# Patient Record
Sex: Female | Born: 1960 | Race: White | Hispanic: No | Marital: Married
Health system: Southern US, Community
[De-identification: ages and names within clinical notes are randomized; demographics above are authoritative.]

---

## 1998-09-21 ENCOUNTER — Other Ambulatory Visit: Admission: RE | Admit: 1998-09-21 | Discharge: 1998-09-21 | Payer: Self-pay | Admitting: *Deleted

## 2000-04-04 ENCOUNTER — Other Ambulatory Visit: Admission: RE | Admit: 2000-04-04 | Discharge: 2000-04-04 | Payer: Self-pay | Admitting: *Deleted

## 2000-06-27 ENCOUNTER — Encounter (INDEPENDENT_AMBULATORY_CARE_PROVIDER_SITE_OTHER): Payer: Self-pay | Admitting: Specialist

## 2000-06-27 ENCOUNTER — Other Ambulatory Visit: Admission: RE | Admit: 2000-06-27 | Discharge: 2000-06-27 | Payer: Self-pay | Admitting: *Deleted

## 2001-09-11 ENCOUNTER — Other Ambulatory Visit: Admission: RE | Admit: 2001-09-11 | Discharge: 2001-09-11 | Payer: Self-pay | Admitting: *Deleted

## 2001-12-05 ENCOUNTER — Encounter: Admission: RE | Admit: 2001-12-05 | Discharge: 2001-12-05 | Payer: Self-pay

## 2002-10-07 ENCOUNTER — Other Ambulatory Visit: Admission: RE | Admit: 2002-10-07 | Discharge: 2002-10-07 | Payer: Self-pay | Admitting: *Deleted

## 2003-12-26 ENCOUNTER — Ambulatory Visit (HOSPITAL_COMMUNITY): Admission: RE | Admit: 2003-12-26 | Discharge: 2003-12-26 | Payer: Self-pay | Admitting: Obstetrics & Gynecology

## 2004-01-28 ENCOUNTER — Encounter: Admission: RE | Admit: 2004-01-28 | Discharge: 2004-01-28 | Payer: Self-pay | Admitting: Obstetrics & Gynecology

## 2004-04-02 ENCOUNTER — Ambulatory Visit (HOSPITAL_COMMUNITY): Admission: RE | Admit: 2004-04-02 | Discharge: 2004-04-02 | Payer: Self-pay | Admitting: Obstetrics

## 2004-10-08 ENCOUNTER — Inpatient Hospital Stay (HOSPITAL_COMMUNITY): Admission: RE | Admit: 2004-10-08 | Discharge: 2004-10-10 | Payer: Self-pay | Admitting: Obstetrics & Gynecology

## 2004-10-08 ENCOUNTER — Encounter (INDEPENDENT_AMBULATORY_CARE_PROVIDER_SITE_OTHER): Payer: Self-pay | Admitting: *Deleted

## 2005-01-20 ENCOUNTER — Ambulatory Visit (HOSPITAL_COMMUNITY): Admission: RE | Admit: 2005-01-20 | Discharge: 2005-01-20 | Payer: Self-pay | Admitting: Obstetrics & Gynecology

## 2006-02-22 ENCOUNTER — Ambulatory Visit (HOSPITAL_COMMUNITY): Admission: RE | Admit: 2006-02-22 | Discharge: 2006-02-22 | Payer: Self-pay | Admitting: Obstetrics & Gynecology

## 2006-03-07 ENCOUNTER — Encounter: Admission: RE | Admit: 2006-03-07 | Discharge: 2006-03-07 | Payer: Self-pay | Admitting: Obstetrics & Gynecology

## 2006-03-29 ENCOUNTER — Ambulatory Visit (HOSPITAL_COMMUNITY): Admission: RE | Admit: 2006-03-29 | Discharge: 2006-03-29 | Payer: Self-pay | Admitting: Gastroenterology

## 2007-03-22 ENCOUNTER — Ambulatory Visit (HOSPITAL_COMMUNITY): Admission: RE | Admit: 2007-03-22 | Discharge: 2007-03-22 | Payer: Self-pay | Admitting: Obstetrics & Gynecology

## 2008-03-28 ENCOUNTER — Ambulatory Visit (HOSPITAL_COMMUNITY): Admission: RE | Admit: 2008-03-28 | Discharge: 2008-03-28 | Payer: Self-pay | Admitting: Obstetrics & Gynecology

## 2009-05-01 ENCOUNTER — Ambulatory Visit (HOSPITAL_COMMUNITY): Admission: RE | Admit: 2009-05-01 | Discharge: 2009-05-01 | Payer: Self-pay | Admitting: Obstetrics & Gynecology

## 2010-05-05 ENCOUNTER — Encounter: Admission: RE | Admit: 2010-05-05 | Discharge: 2010-05-05 | Payer: Self-pay | Admitting: Family Medicine

## 2010-09-26 ENCOUNTER — Encounter: Payer: Self-pay | Admitting: Obstetrics & Gynecology

## 2011-01-21 NOTE — H&P (Signed)
NAME:  Kim Carroll, HAGE            ACCOUNT NO.:  [id number]   MEDICAL RECORD NO.:  [id number]          PATIENT TYPE:  INP   LOCATION:  NA                            FACILITY:  [facility]   PHYSICIAN:  Roseanna Rainbow, M.D.DATE OF BIRTH:  1960-12-18   DATE OF ADMISSION:  DATE OF DISCHARGE:                                HISTORY & PHYSICAL   CHIEF COMPLAINT:  The patient is a 50 year old Caucasian female with  secondary dysmenorrhea and menorrhagia who presents for total abdominal  hysterectomy.   HISTORY OF PRESENT ILLNESS:  The patient gives a long history of abnormal  uterine bleeding and painful menses.  She is status post an attempt at  conservative management with an endometrial ablation--an endometrial  ablation was performed approximately six months ago with a NovaSure.  Workup  to date has included a pelvic ultrasound that was essentially normal.  She  does not have any demonstrable anemia.  She has also had an attempted  medical management with Depo-Provera.   PAST OBSTETRICAL AND GYNECOLOGICAL HISTORY:  1.  Please see the above.  2.  She has been pregnant four times.  3.  She has had two live births.  4.  She has had a cesarean delivery.  5.  She has had a bilateral tubal ligation.   PAST MEDICAL HISTORY:  1.  Depression.  2.  Migraine headaches.   PAST SURGICAL HISTORY:  Please see the above.   FAMILY HISTORY:  Remarkable for a myocardial infarction.   ALLERGIES:  PENICILLIN.   MEDICATIONS:  Zoloft.   REVIEW OF SYSTEMS:  NEURO:  Headaches.  Difficulty concentrating.  PSYCHIATRIC:  Mood swings and anxiety.  GU:  Decreased in sexual desire.  GENERAL:  Decrease in energy level.   PHYSICAL EXAMINATION:  VITAL SIGNS:  Blood pressure 117/83, pulse 74,  temperature 98.1, weight 185 pounds.  GENERAL:  A well developed, well nourished, Caucasian female in no apparent  distress.  NECK:  Supple.  No thyromegaly.  CHEST:  Lungs clear to auscultation bilaterally.  HEART:  Regular rate and rhythm.  ABDOMEN:  No organomegaly.  PELVIC:  External female genitalia: normal EGBUS.  Speculum exam: the vagina  is clean.  The cervix is without lesions.  On bimanual exam the uterus is  normal size, anteverted, nontender.  The adnexa are not palpable, nontender.  EXTREMITIES:  No clubbing, cyanosis, or edema.  SKIN:  Without rash.   ASSESSMENT:  A patient with secondary dysmenorrhea and menorrhagia  refractory to attempted medical management and a conservative surgical  procedure.   PLAN:  The planned procedure is a total abdominal hysterectomy.  The risks,  benefits, and alternative forms of management were reviewed with the  patient.  Informed consent has been obtained.      LAJ/MEDQ  D:  09/24/2004  T:  09/24/2004  Job:  [id number]

## 2011-01-21 NOTE — Discharge Summary (Signed)
NAMEDARRIN, Kim Carroll            ACCOUNT NO.:  [id number]   MEDICAL RECORD NO.:  [id number]          PATIENT TYPE:  INP   LOCATION:  9310                          FACILITY:  [facility]   PHYSICIAN:  Charles A. Clearance Coots, M.D.DATE OF BIRTH:  [birth year]   DATE OF ADMISSION:  10/08/2004  DATE OF DISCHARGE:  10/10/2004                                 DISCHARGE SUMMARY   ADMISSION DIAGNOSES:  1.  Dysfunctional uterine bleeding.  2.  Pelvic pain.   DISCHARGE DIAGNOSES:  1.  Dysfunctional uterine bleeding.  2.  Pelvic pain.  3.  Status post total abdominal hysterectomy.  4.  Discharged home in good condition.   REASON FOR ADMISSION:  A 49 year old white female with secondary  dysmenorrhea and menorrhagia who had been tried on conservative management  with endometrial ablation, approximately six months ago, and medical  therapy, but did not have adequate relief of her symptoms.  The patient  desired definitive surgical therapy.   PAST MEDICAL HISTORY:  Surgery; endometrial ablation.  Illnesses; depression  and migraine headaches.   MEDICATIONS:  Zoloft.   ALLERGIES:  PENICILLIN.   SOCIAL HISTORY:  Married.  Negative tobacco, alcohol, or recreational drug  use.   FAMILY HISTORY:  Remarkable for myocardial infarction.   REVIEW OF SYSTEMS:  NEUROLOGY:  Headaches and difficulty concentrating.  PSYCHIATRIC:  Mood swings and anxiety.   PHYSICAL EXAMINATION:  GENERAL:  Obese white female in no acute distress.  VITAL SIGNS:  Temperature 98.1, pulse 74, respiratory rate 20, blood  pressure 117/83.  NECK:  Supple, negative for adenopathy.  LUNGS:  Clear to auscultation bilaterally.  HEART:  Regular rate and rhythm.  ABDOMEN:  Soft and nontender.  No organomegaly or masses appreciated.  PELVIC:  Normal external female genitalia.  Vaginal mucosa normal.  Uterus  normal size, anteverted, nontender.  Normal shape and contour.  Adnexa not  palpable due to obesity, but nontender.   LABORATORY  DATA:  Hemoglobin 14.6, hematocrit 42.3, white blood cell count  7300, platelets 237,000.  Basic metabolic panel within normal limits.  Coags  within normal limits.  Comprehensive metabolic panel within normal limits.   HOSPITAL COURSE:  The patient underwent a total abdominal hysterectomy on  October 08, 2004.  There were no intraoperative complications.  Postoperative course was uncomplicated and she was discharged on  postoperative day #2 in good condition.   DISCHARGE LABORATORY DATA:  Hemoglobin 12.4, hematocrit 35.6.  Basic  metabolic panel within normal limits.   DISPOSITION:  Tylox and ibuprofen prescribed for pain.  Routine written  instructions for discharge after hysterectomy were given per booklet.  The  patient is to follow up in the office on Monday for removal of staples and  to schedule a two-week follow-up.      CAH/MEDQ  D:  10/10/2004  T:  10/11/2004  Job:  [id number]

## 2011-01-21 NOTE — Op Note (Signed)
NAME:  Kim Carroll, Kim Carroll            ACCOUNT NO.:  [id number]   MEDICAL RECORD NO.:  [id number]          PATIENT TYPE:  INP   LOCATION:  9310                          FACILITY:  [facility]   PHYSICIAN:  Roseanna Rainbow, M.D.DATE OF BIRTH:  [birth year]   DATE OF PROCEDURE:  10/08/2004  DATE OF DISCHARGE:                                 OPERATIVE REPORT   PREOPERATIVE DIAGNOSIS:  Hypermenorrhea and secondary dysmenorrhea  refractory to conservative and medical therapies.   POSTOPERATIVE DIAGNOSIS:  Hypermenorrhea and secondary dysmenorrhea  refractory to conservative and medical therapies.   PROCEDURE:  Total abdominal hysterectomy with lysis of adhesions.   SURGEON:  Dr. Tamela Oddi   ASSISTANT:  Dr. Clearance Coots   ANESTHESIA:  General tracheal.   COMPLICATIONS:  None.   ESTIMATED BLOOD LOSS:  100 mL.   FLUIDS AND URINE OUTPUT:  As per anesthesiology.   FINDINGS:  Exam under anesthesia essentially normal.   OPERATIVE FINDINGS:  There were adhesions involving the anterior cul-de-sac.  There was also an adhesion involving the sigmoid colon to the left fundus.  The ovaries appeared normal.   PROCEDURE:  The risks, benefits, indications, and alternatives of the  procedures were reviewed with the patient, and informed consent had been  obtained.  The patient was taken to the operating room with an IV running.  The patient was placed in the dorsal lithotomy position, given general  anesthesia, and prepped and draped in the usual sterile fashion.  A  Pfannenstiel incision was then made through the previous scar and extended  to the rectus fascia with the Bovie.  The fascia was then incised  bilaterally with curved Mayo scissors and the muscles of the anterior  abdominal wall were separated in the midline.  The parietal peritoneum was  entered sharply.  The pelvis was examined with the findings noted above.  An  O'Connor-O'Sullivan retractor was then placed into the incision and the  bowel packed away with moistened laparotomy sponges.  Two long Kelly clamps  were placed on the cornu and used for retraction.  The round ligaments on  both sides were then divided with the Bovie.  The anterior lip of the broad  ligament was incised along the bladder reflection to the midline from both  sides.  The anterior cul-de-sac adhesions as well as the bladder were then  sharply dissected off the lower uterine segment and cervix.  The utero-  ovarian ligaments and fallopian tubes on both sides were then doubly  clamped, transected, and both free ligatures and suture ligatures were  placed using O Vicryl.  Hemostasis was visualized.  The uterine arteries  were skeletonized bilaterally, clamped with parametrial clamps, transected,  and suture-ligated with O Vicryl.  Again, hemostasis was assured.  The  uterosacral ligaments were clamped on both sides, transected, and suture  ligated in a similar fashion.  The cervix and uterus were amputated with  scissors.  The vaginal cuff angles were closed with suture ligatures of O  Vicryl.  The remainder of the vaginal cuff was closed with interrupted 0  Vicryl figure-of-eight sutures.  Adequate hemostasis again was assured.  The  pelvis was irrigated copiously with warm normal saline.  All laparotomy  sponges and instruments were removed from the abdomen.  The fascia was  closed with running O PDS, and hemostasis was assured.  The skin was closed  with staples.  Sponge, lap, needle, and instrument counts were correct x 2.  The patient was taken to the PACU awake and in stable condition.      LAJ/MEDQ  D:  10/08/2004  T:  10/08/2004  Job:  [id number]

## 2011-01-21 NOTE — Op Note (Signed)
NAME:  Kim Carroll, Kim Carroll            ACCOUNT NO.:  [id number]   MEDICAL RECORD NO.:  [id number]          PATIENT TYPE:  AMB   LOCATION:  ENDO                         FACILITY:  [facility]   PHYSICIAN:  Shirley Friar, MDDATE OF BIRTH:  11-Dec-1960   DATE OF PROCEDURE:  03/29/2006  DATE OF DISCHARGE:                                 OPERATIVE REPORT   PROCEDURE:  Upper endoscopy and Bravo placement.   INDICATIONS FOR PROCEDURE:  Atypical chest pain, lump sensation.   MEDICATIONS:  Fentanyl 100 mcg IV, Versed 10 mg IV.   FINDINGS:  The endoscope was inserted into the oropharynx and the esophagus  was intubated which was normal in its entirety.  The squamocolumnar junction  was noted at 37 cm from the incisors and 40 cm from the outside of the bite  block.  The endoscope was then advanced into the stomach which was normal in  its entirety.  Retroflexion was done which revealed normal proximal stomach  and angularis.  The endoscope was straightened and advanced down to the  duodenal bulb and second portion of duodenum which were both normal.  The  endoscope was withdrawn back into the esophagus and the squamocolumnar  junction was again noted at the above markings and then the endoscope was  removed.  The Bravo capsule was then placed after it had been calibrated  into the wall of the esophagus 6 cm above the squamocolumnar junction.  The  endoscope was then inserted back into the esophagus and the placement of the  Bravo capsule was confirmed.      Shirley Friar, MD  Electronically Signed     VCS/MEDQ  D:  03/29/2006  T:  03/29/2006  Job:  [id number]   cc:   Chales Salmon. Abigail Miyamoto, M.D.

## 2011-01-21 NOTE — Op Note (Signed)
NAME:  Kim Carroll, Kim Carroll                        ACCOUNT NO.:  [id number]   MEDICAL RECORD NO.:  [id number]                   PATIENT TYPE:  AMB   LOCATION:  SDC                                  [facility]   PHYSICIAN:  Charles A. Clearance Coots, M.D.             DATE OF BIRTH:  [birth year]   DATE OF PROCEDURE:  04/02/2004  DATE OF DISCHARGE:                                 OPERATIVE REPORT   PREOPERATIVE DIAGNOSES:  Menorrhagia.   POSTOPERATIVE DIAGNOSES:  Menorrhagia.   PROCEDURE:  Hysteroscopy, bipolar endometrial ablation (Novasure).   SURGEON:  Charles A. Clearance Coots, M.D.   ANESTHESIA:  General.   ESTIMATED BLOOD LOSS:  Negligible.   COMPLICATIONS:  None.   SPECIMENS:  None.   DESCRIPTION OF PROCEDURE:  The patient was brought to the operating room and  after satisfactory general endotracheal anesthesia, the legs were brought up  in stirrups and the vagina was prepped and draped in the usual sterile  fashion. The urinary bladder was emptied of approximately 300 mL of clear  urine. Bimanual examination revealed the uterus to be mid position, normal  size, shape and contour. A sterile speculum was inserted in the vaginal  vault and the cervix was isolated. The anterior lip of the cervix was  grasped with a single tooth tenaculum.  A paracervical block of  approximately 20 mL of 2% Xylocaine was injected in each lateral fornix with  approximately 10 mL in each lateral fornix with a total of 20 mL.  The  uterus was sounded to 9 cm.  The level of the internal os of the cervix was  measured with Hegar dilator. The cervix was then dilated to a #23 Pratt  dilator. The 5 mm hysteroscope was then introduced into the uterine cavity  and survey of the uterine cavity was done via hysteroscopy.  There were no  endometrial polyps noted.  The bipolar endometrial ablation procedure was  then done in routine fashion without complications with power of 158 watts  at a time of 50 seconds.  There were  no complications. Post procedure  hysteroscopy was then performed and the cavity appeared to have a good  result from the bipolar ablation. All instruments were retired. The patient  tolerated the procedure well and was transported to the recovery room in  satisfactory condition.                                               Charles A. Clearance Coots, M.D.    CAH/MEDQ  D:  04/02/2004  T:  04/02/2004  Job:  [id number]

## 2011-01-21 NOTE — Op Note (Signed)
NAME:  Kim Carroll, Kim Carroll            ACCOUNT NO.:  [id number]   MEDICAL RECORD NO.:  [id number]          PATIENT TYPE:  AMB   LOCATION:  ENDO                         FACILITY:  [facility]   PHYSICIAN:  Shirley Friar, MDDATE OF BIRTH:  16-Dec-1960   DATE OF PROCEDURE:  03/29/2006  DATE OF DISCHARGE:  03/29/2006                                 OPERATIVE REPORT   PROCEDURE:  Bravo capsule reading.   INDICATION:  Chest pain.   FINDINGS:  DeMeester score - acid reflux analysis on day 1 equals total  score of 4.6 with normal being less than 14.72.  DeMeester score - acid  reflux analysis of day 2 equals total score of 10.4 with normals being less  than 14.72.  Total DeMeester score 7.4 with normal being less than 14.72.   IMPRESSION:  Normal Bravo capsule study - chest pain not related to acid  reflux.  Possibilities for chest pain include functional pain versus  nonacidic reflux versus nondigestive source such as pulmonary/cardiac.   PLAN:  1. Defer to patient's primary care physician whether additional workup is      needed for nondigestive sources.  2. Change AcipHex to once a day or as needed.      Shirley Friar, MD  Electronically Signed     VCS/MEDQ  D:  04/19/2006  T:  04/19/2006  Job:  [id number]   cc:   Chales Salmon. Abigail Miyamoto, M.D.

## 2011-02-07 ENCOUNTER — Other Ambulatory Visit: Payer: Self-pay | Admitting: Family Medicine

## 2011-02-07 DIAGNOSIS — R609 Edema, unspecified: Secondary | ICD-10-CM

## 2011-02-07 DIAGNOSIS — R52 Pain, unspecified: Secondary | ICD-10-CM

## 2011-02-08 ENCOUNTER — Ambulatory Visit
Admission: RE | Admit: 2011-02-08 | Discharge: 2011-02-08 | Disposition: A | Discharge status: Home / Self Care | Payer: BC Managed Care – PPO | Source: Ambulatory Visit | Attending: Family Medicine | Admitting: Family Medicine

## 2011-02-08 DIAGNOSIS — R52 Pain, unspecified: Secondary | ICD-10-CM

## 2011-02-08 DIAGNOSIS — R609 Edema, unspecified: Secondary | ICD-10-CM

## 2011-07-06 ENCOUNTER — Other Ambulatory Visit: Payer: Self-pay | Admitting: Family Medicine

## 2011-07-06 DIAGNOSIS — Z1231 Encounter for screening mammogram for malignant neoplasm of breast: Secondary | ICD-10-CM

## 2011-07-19 ENCOUNTER — Ambulatory Visit
Admission: RE | Admit: 2011-07-19 | Discharge: 2011-07-19 | Disposition: A | Discharge status: Home / Self Care | Payer: BC Managed Care – PPO | Source: Ambulatory Visit | Attending: Family Medicine | Admitting: Family Medicine

## 2011-07-19 DIAGNOSIS — Z1231 Encounter for screening mammogram for malignant neoplasm of breast: Secondary | ICD-10-CM

## 2012-07-04 ENCOUNTER — Other Ambulatory Visit: Payer: Self-pay | Admitting: Family Medicine

## 2012-07-04 DIAGNOSIS — Z1231 Encounter for screening mammogram for malignant neoplasm of breast: Secondary | ICD-10-CM

## 2012-07-19 ENCOUNTER — Ambulatory Visit (INDEPENDENT_AMBULATORY_CARE_PROVIDER_SITE_OTHER): Payer: BC Managed Care – PPO

## 2012-07-19 DIAGNOSIS — Z1231 Encounter for screening mammogram for malignant neoplasm of breast: Secondary | ICD-10-CM

## 2014-04-18 ENCOUNTER — Other Ambulatory Visit: Payer: Self-pay | Admitting: Family Medicine

## 2014-04-18 DIAGNOSIS — Z139 Encounter for screening, unspecified: Secondary | ICD-10-CM

## 2014-04-22 ENCOUNTER — Ambulatory Visit: Payer: BC Managed Care – PPO

## 2014-05-01 ENCOUNTER — Ambulatory Visit (INDEPENDENT_AMBULATORY_CARE_PROVIDER_SITE_OTHER): Payer: BC Managed Care – PPO

## 2014-05-01 DIAGNOSIS — Z139 Encounter for screening, unspecified: Secondary | ICD-10-CM

## 2014-05-01 DIAGNOSIS — Z1231 Encounter for screening mammogram for malignant neoplasm of breast: Secondary | ICD-10-CM

## 2015-04-03 ENCOUNTER — Other Ambulatory Visit: Payer: Self-pay | Admitting: Family Medicine

## 2015-04-03 DIAGNOSIS — Z1231 Encounter for screening mammogram for malignant neoplasm of breast: Secondary | ICD-10-CM

## 2015-05-06 ENCOUNTER — Ambulatory Visit: Payer: BC Managed Care – PPO

## 2015-05-12 ENCOUNTER — Ambulatory Visit (INDEPENDENT_AMBULATORY_CARE_PROVIDER_SITE_OTHER): Payer: BC Managed Care – PPO

## 2015-05-12 DIAGNOSIS — Z1231 Encounter for screening mammogram for malignant neoplasm of breast: Secondary | ICD-10-CM

## 2016-05-24 ENCOUNTER — Other Ambulatory Visit: Payer: Self-pay | Admitting: Family Medicine

## 2016-05-24 DIAGNOSIS — Z1231 Encounter for screening mammogram for malignant neoplasm of breast: Secondary | ICD-10-CM

## 2016-05-31 ENCOUNTER — Ambulatory Visit (INDEPENDENT_AMBULATORY_CARE_PROVIDER_SITE_OTHER): Payer: BC Managed Care – PPO

## 2016-05-31 DIAGNOSIS — Z1231 Encounter for screening mammogram for malignant neoplasm of breast: Secondary | ICD-10-CM

## 2017-07-04 ENCOUNTER — Other Ambulatory Visit: Payer: Self-pay | Admitting: Family Medicine

## 2017-07-04 DIAGNOSIS — Z1231 Encounter for screening mammogram for malignant neoplasm of breast: Secondary | ICD-10-CM

## 2017-07-07 ENCOUNTER — Ambulatory Visit: Payer: BC Managed Care – PPO

## 2017-07-14 ENCOUNTER — Other Ambulatory Visit: Payer: Self-pay | Admitting: Family Medicine

## 2017-07-14 ENCOUNTER — Ambulatory Visit (INDEPENDENT_AMBULATORY_CARE_PROVIDER_SITE_OTHER): Payer: BC Managed Care – PPO

## 2017-07-14 DIAGNOSIS — Z1231 Encounter for screening mammogram for malignant neoplasm of breast: Secondary | ICD-10-CM

## 2017-07-31 ENCOUNTER — Other Ambulatory Visit: Payer: Self-pay | Admitting: Family Medicine

## 2018-06-21 ENCOUNTER — Other Ambulatory Visit: Payer: Self-pay | Admitting: Family Medicine

## 2018-06-21 DIAGNOSIS — I10 Essential (primary) hypertension: Secondary | ICD-10-CM

## 2018-06-21 DIAGNOSIS — Z8249 Family history of ischemic heart disease and other diseases of the circulatory system: Secondary | ICD-10-CM

## 2018-07-05 ENCOUNTER — Ambulatory Visit
Admission: RE | Admit: 2018-07-05 | Discharge: 2018-07-05 | Disposition: A | Discharge status: Home / Self Care | Payer: No Typology Code available for payment source | Source: Ambulatory Visit | Attending: Family Medicine | Admitting: Family Medicine

## 2018-07-05 DIAGNOSIS — Z8249 Family history of ischemic heart disease and other diseases of the circulatory system: Secondary | ICD-10-CM

## 2018-07-05 DIAGNOSIS — I10 Essential (primary) hypertension: Secondary | ICD-10-CM

## 2019-09-06 DIAGNOSIS — C569 Malignant neoplasm of unspecified ovary: Secondary | ICD-10-CM

## 2019-09-06 DIAGNOSIS — Z9221 Personal history of antineoplastic chemotherapy: Secondary | ICD-10-CM

## 2019-09-06 HISTORY — DX: Malignant neoplasm of unspecified ovary: C56.9

## 2019-09-06 HISTORY — DX: Personal history of antineoplastic chemotherapy: Z92.21

## 2019-10-03 ENCOUNTER — Other Ambulatory Visit: Payer: Self-pay | Admitting: Family Medicine

## 2019-10-03 DIAGNOSIS — Z1231 Encounter for screening mammogram for malignant neoplasm of breast: Secondary | ICD-10-CM

## 2019-10-10 ENCOUNTER — Ambulatory Visit (INDEPENDENT_AMBULATORY_CARE_PROVIDER_SITE_OTHER): Payer: BC Managed Care – PPO

## 2019-10-10 ENCOUNTER — Other Ambulatory Visit: Payer: Self-pay

## 2019-10-10 DIAGNOSIS — Z1231 Encounter for screening mammogram for malignant neoplasm of breast: Secondary | ICD-10-CM | POA: Diagnosis not present

## 2019-10-25 ENCOUNTER — Ambulatory Visit: Payer: BC Managed Care – PPO | Attending: Internal Medicine

## 2019-10-25 DIAGNOSIS — Z23 Encounter for immunization: Secondary | ICD-10-CM

## 2019-10-25 NOTE — Progress Notes (Signed)
   Covid-19 Vaccination Clinic  Name:  Charna Fathi    MRN: [id number] DOB: 1961-03-11  10/25/2019  Ms. Jiggetts was observed post Covid-19 immunization for 15 minutes without incidence. She was provided with Vaccine Information Sheet and instruction to access the V-Safe system.   Ms. Trudo was instructed to call 911 with any severe reactions post vaccine: Marland Kitchen Difficulty breathing  . Swelling of your face and throat  . A fast heartbeat  . A bad rash all over your body  . Dizziness and weakness    Immunizations Administered    Name Date Dose VIS Date Route   Pfizer COVID-19 Vaccine 10/25/2019 11:55 AM 0.3 mL 08/16/2019 Intramuscular   Manufacturer: [place]   Lot: [id number]   [place]: [id number]

## 2019-11-19 ENCOUNTER — Ambulatory Visit: Payer: BC Managed Care – PPO | Attending: Internal Medicine

## 2019-11-19 DIAGNOSIS — Z23 Encounter for immunization: Secondary | ICD-10-CM

## 2019-11-19 NOTE — Progress Notes (Signed)
   Covid-19 Vaccination Clinic  Name:  Kim Carroll    MRN: [id number] DOB: 01/03/1961  11/19/2019  [name] was observed post Covid-19 immunization for 15 minutes without incident. She was provided with Vaccine Information Sheet and instruction to access the V-Safe system.   [name] was instructed to call 911 with any severe reactions post vaccine: Marland Kitchen Difficulty breathing  . Swelling of face and throat  . A fast heartbeat  . A bad rash all over body  . Dizziness and weakness   Immunizations Administered    Name Date Dose VIS Date Route   Pfizer COVID-19 Vaccine 11/19/2019  3:41 PM 0.3 mL 08/16/2019 Intramuscular   Manufacturer: [place]   Lot: [id number]   [place]: [id number]

## 2020-10-12 ENCOUNTER — Other Ambulatory Visit: Payer: Self-pay | Admitting: Family Medicine

## 2020-10-12 DIAGNOSIS — Z1231 Encounter for screening mammogram for malignant neoplasm of breast: Secondary | ICD-10-CM

## 2020-11-11 ENCOUNTER — Other Ambulatory Visit: Payer: Self-pay

## 2020-11-11 ENCOUNTER — Ambulatory Visit (INDEPENDENT_AMBULATORY_CARE_PROVIDER_SITE_OTHER): Payer: BC Managed Care – PPO

## 2020-11-11 DIAGNOSIS — Z1231 Encounter for screening mammogram for malignant neoplasm of breast: Secondary | ICD-10-CM

## 2022-02-01 ENCOUNTER — Other Ambulatory Visit: Payer: Self-pay | Admitting: Family Medicine

## 2022-02-01 DIAGNOSIS — Z1231 Encounter for screening mammogram for malignant neoplasm of breast: Secondary | ICD-10-CM

## 2022-02-02 ENCOUNTER — Ambulatory Visit: Payer: BC Managed Care – PPO

## 2022-03-02 ENCOUNTER — Ambulatory Visit (INDEPENDENT_AMBULATORY_CARE_PROVIDER_SITE_OTHER): Payer: BC Managed Care – PPO

## 2022-03-02 DIAGNOSIS — Z1231 Encounter for screening mammogram for malignant neoplasm of breast: Secondary | ICD-10-CM | POA: Diagnosis not present

## 2023-01-06 IMAGING — MG MM DIGITAL SCREENING BILAT W/ TOMO AND CAD
8 series · 8 of 24 positions shown · non-contrast
Comparison: Previous exam(s).

CLINICAL DATA: Screening.

EXAM:
DIGITAL SCREENING BILATERAL MAMMOGRAM WITH TOMOSYNTHESIS AND CAD
TECHNIQUE: Bilateral screening digital craniocaudal and mediolateral oblique
mammograms were obtained. Bilateral screening digital breast
tomosynthesis was performed. The images were evaluated with
computer-aided detection.

[R CC synth-2D]
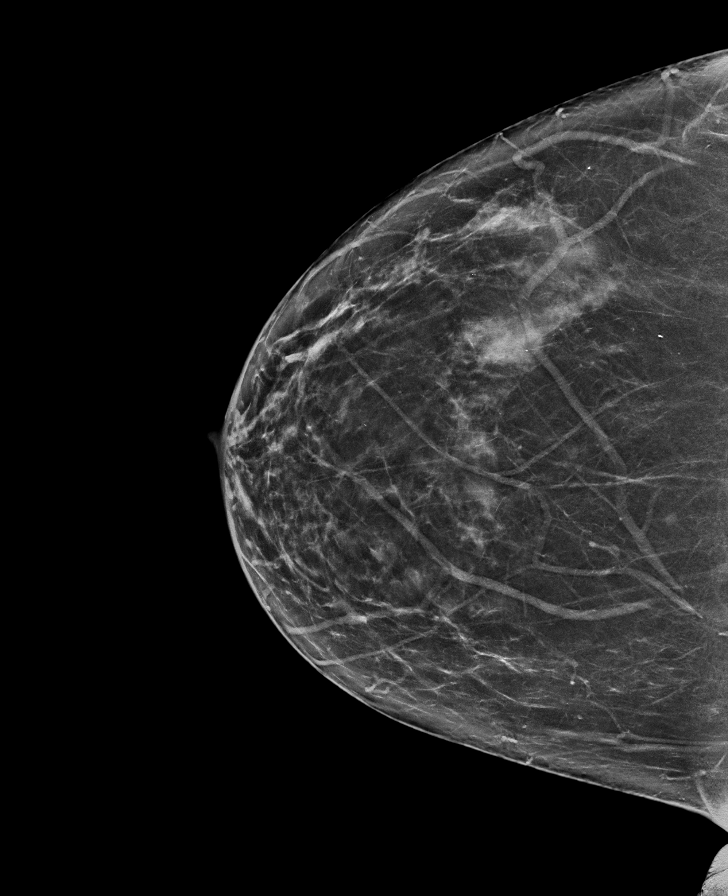

[L CC synth-2D]
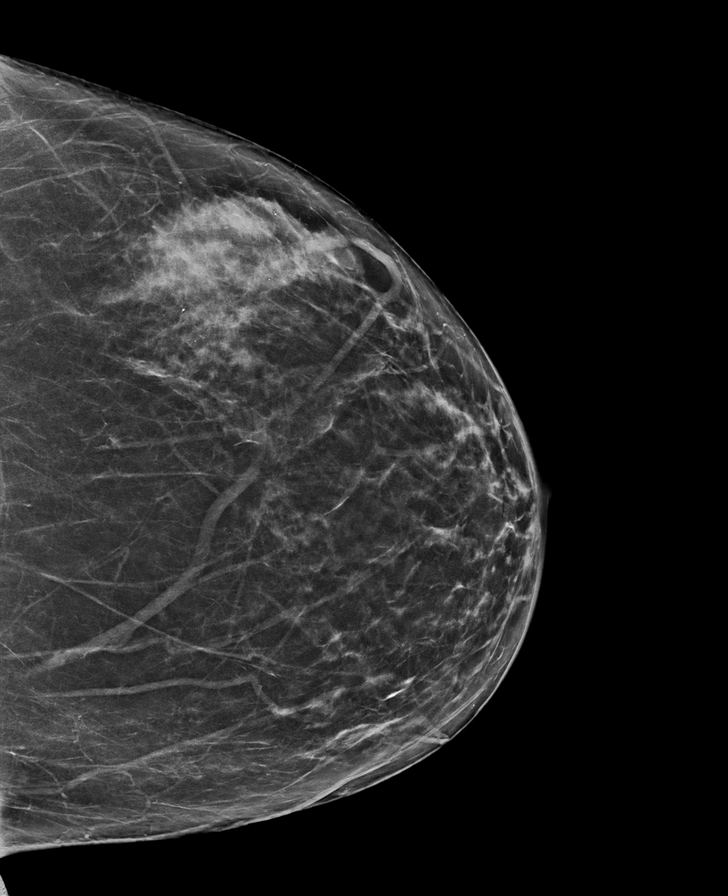

[R MLO synth-2D]
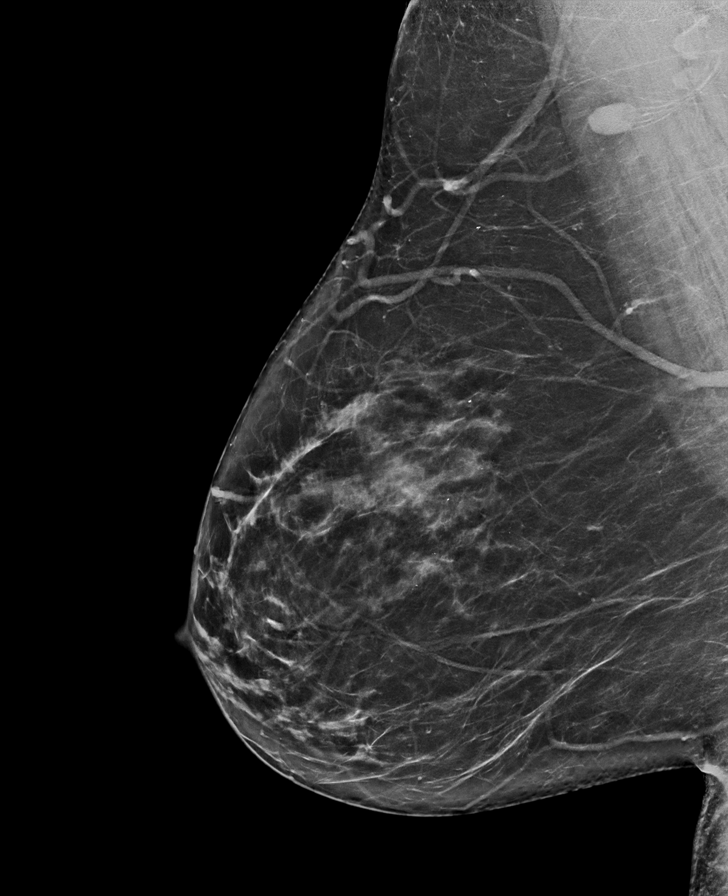

[L MLO synth-2D]
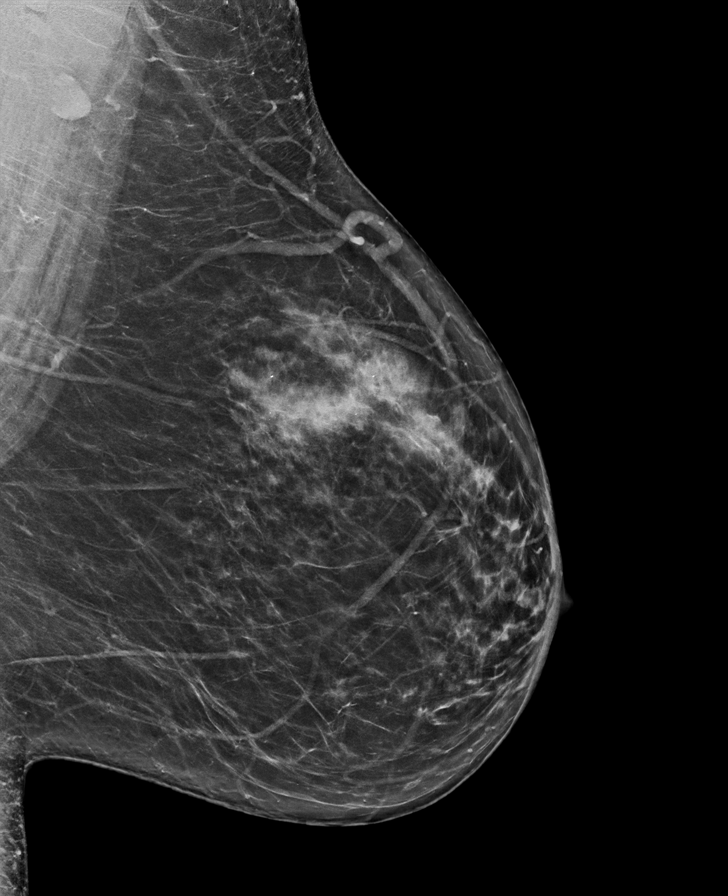

[R CC tomo · tomo slice 37/73.0]
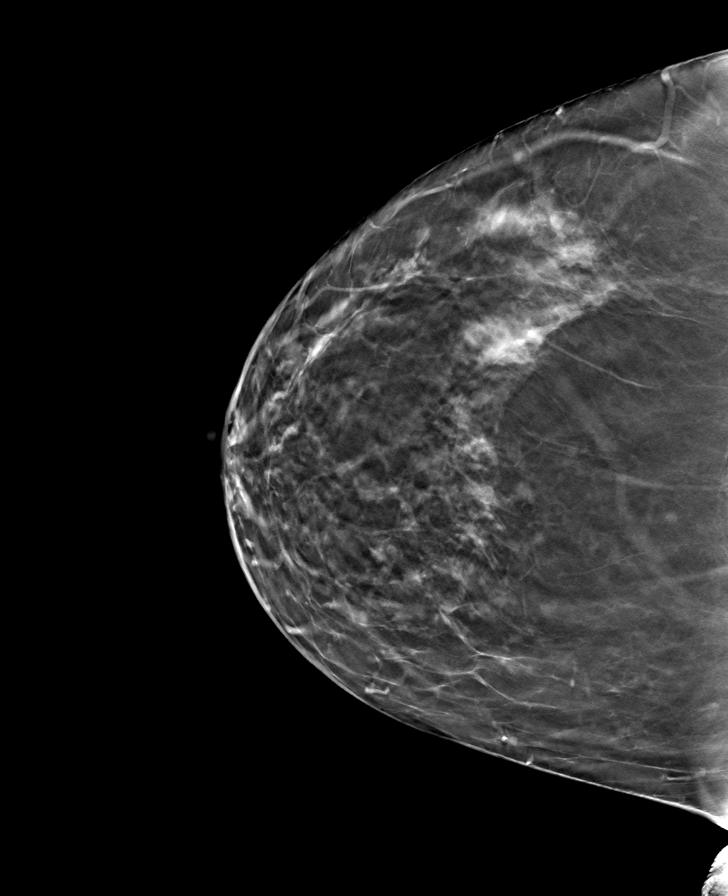

[L MLO tomo · tomo slice 41/81.0]
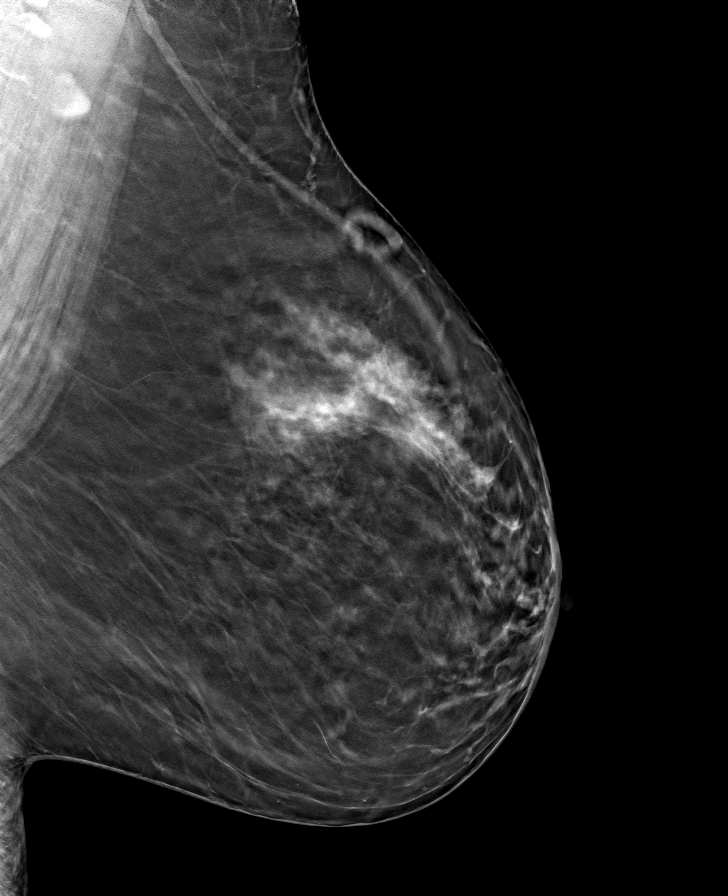

[L CC tomo · tomo slice 37/72.0]
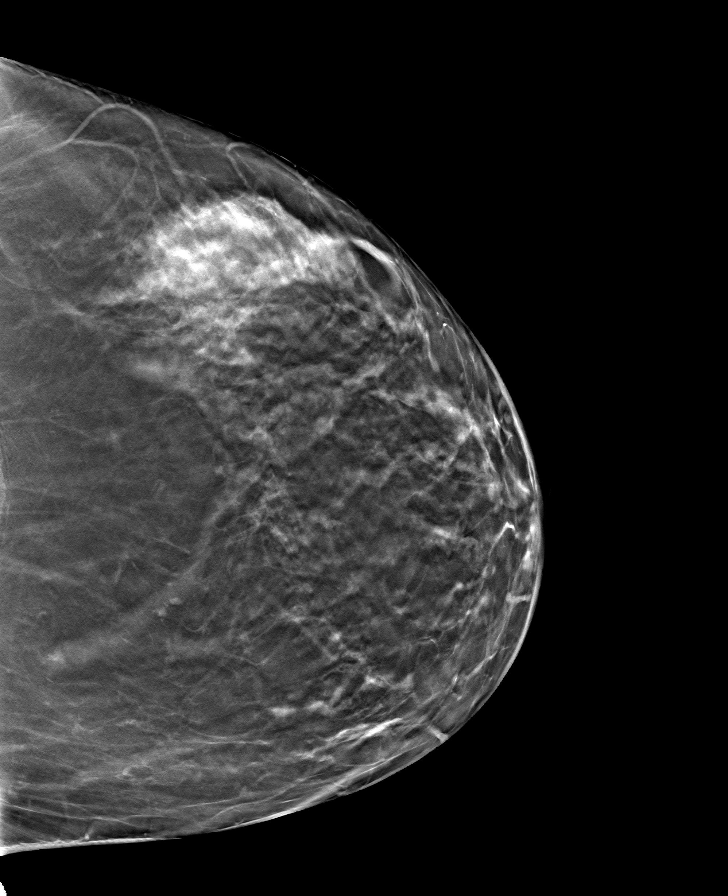

[R MLO tomo · tomo slice 41/82.0]
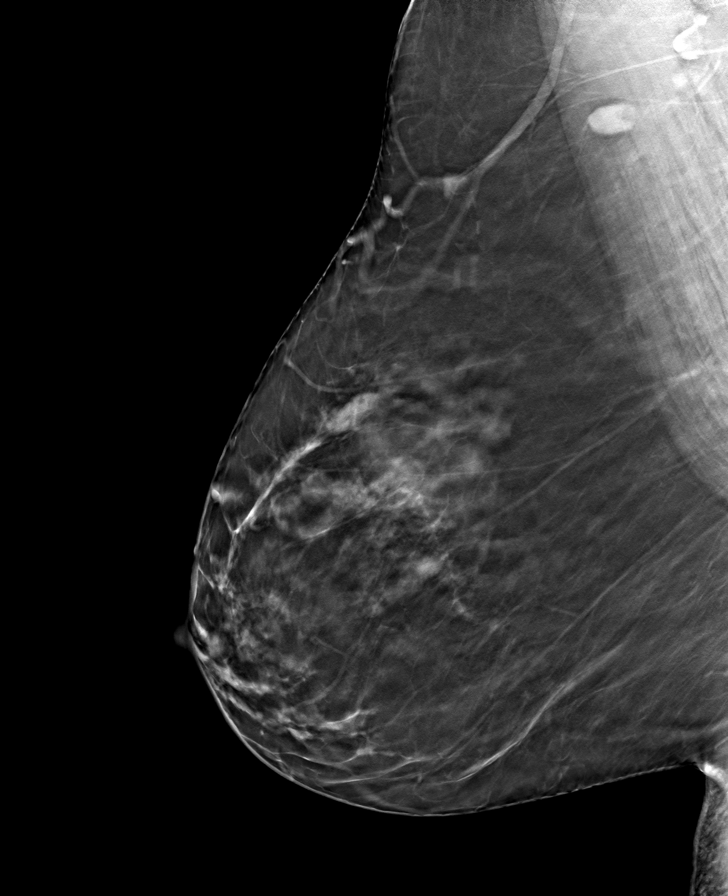

[8 of 24 positions shown; findings below may reference images not displayed]

ACR Breast Density Category b: There are scattered areas of
fibroglandular density.
FINDINGS: There are no findings suspicious for malignancy. The images were
evaluated with computer-aided detection.
IMPRESSION: No mammographic evidence of malignancy. A result letter of this
screening mammogram will be mailed directly to the patient.

RECOMMENDATION:
Screening mammogram in one year. (Code:WJ-I-BG6)

BI-RADS CATEGORY  1: Negative.

## 2023-03-06 ENCOUNTER — Other Ambulatory Visit: Payer: Self-pay | Admitting: Family Medicine

## 2023-03-06 DIAGNOSIS — Z1231 Encounter for screening mammogram for malignant neoplasm of breast: Secondary | ICD-10-CM

## 2023-03-15 ENCOUNTER — Ambulatory Visit: Payer: BC Managed Care – PPO

## 2023-03-15 DIAGNOSIS — Z1231 Encounter for screening mammogram for malignant neoplasm of breast: Secondary | ICD-10-CM

## 2024-05-24 ENCOUNTER — Other Ambulatory Visit: Payer: Self-pay | Admitting: Family Medicine

## 2024-05-24 DIAGNOSIS — Z1231 Encounter for screening mammogram for malignant neoplasm of breast: Secondary | ICD-10-CM

## 2024-06-13 ENCOUNTER — Ambulatory Visit (HOSPITAL_BASED_OUTPATIENT_CLINIC_OR_DEPARTMENT_OTHER)
Admission: RE | Admit: 2024-06-13 | Discharge: 2024-06-13 | Disposition: A | Discharge status: Home / Self Care | Payer: Self-pay | Source: Ambulatory Visit | Attending: Family Medicine | Admitting: Family Medicine

## 2024-06-13 ENCOUNTER — Encounter (HOSPITAL_BASED_OUTPATIENT_CLINIC_OR_DEPARTMENT_OTHER): Payer: Self-pay

## 2024-06-13 DIAGNOSIS — Z1231 Encounter for screening mammogram for malignant neoplasm of breast: Secondary | ICD-10-CM | POA: Diagnosis present
